# Patient Record
Sex: Male | Born: 1985 | Race: White | Hispanic: No | Marital: Single
Health system: Southern US, Community
[De-identification: ages and names within clinical notes are randomized; demographics above are authoritative.]

---

## 2020-12-21 ENCOUNTER — Inpatient Hospital Stay (HOSPITAL_COMMUNITY)
Admission: EM | Admit: 2020-12-21 | Discharge: 2020-12-25 | DRG: 964 | Disposition: A | Discharge status: Home / Self Care | Payer: 59 | Attending: Surgery | Admitting: Surgery

## 2020-12-21 ENCOUNTER — Inpatient Hospital Stay (HOSPITAL_COMMUNITY): Payer: 59

## 2020-12-21 ENCOUNTER — Emergency Department (HOSPITAL_COMMUNITY): Payer: 59

## 2020-12-21 ENCOUNTER — Other Ambulatory Visit: Payer: Self-pay

## 2020-12-21 ENCOUNTER — Encounter (HOSPITAL_COMMUNITY): Payer: Self-pay

## 2020-12-21 DIAGNOSIS — Z720 Tobacco use: Secondary | ICD-10-CM

## 2020-12-21 DIAGNOSIS — D62 Acute posthemorrhagic anemia: Secondary | ICD-10-CM | POA: Diagnosis present

## 2020-12-21 DIAGNOSIS — S2241XA Multiple fractures of ribs, right side, initial encounter for closed fracture: Secondary | ICD-10-CM | POA: Diagnosis present

## 2020-12-21 DIAGNOSIS — D72829 Elevated white blood cell count, unspecified: Secondary | ICD-10-CM | POA: Diagnosis present

## 2020-12-21 DIAGNOSIS — T1490XA Injury, unspecified, initial encounter: Secondary | ICD-10-CM

## 2020-12-21 DIAGNOSIS — Z20822 Contact with and (suspected) exposure to covid-19: Secondary | ICD-10-CM | POA: Diagnosis present

## 2020-12-21 DIAGNOSIS — S36116A Major laceration of liver, initial encounter: Secondary | ICD-10-CM | POA: Diagnosis present

## 2020-12-21 DIAGNOSIS — S272XXA Traumatic hemopneumothorax, initial encounter: Secondary | ICD-10-CM | POA: Diagnosis present

## 2020-12-21 DIAGNOSIS — W320XXA Accidental handgun discharge, initial encounter: Secondary | ICD-10-CM

## 2020-12-21 DIAGNOSIS — J942 Hemothorax: Secondary | ICD-10-CM

## 2020-12-21 DIAGNOSIS — Y249XXA Unspecified firearm discharge, undetermined intent, initial encounter: Secondary | ICD-10-CM

## 2020-12-21 DIAGNOSIS — S27331A Laceration of lung, unilateral, initial encounter: Secondary | ICD-10-CM | POA: Diagnosis present

## 2020-12-21 DIAGNOSIS — Z9689 Presence of other specified functional implants: Secondary | ICD-10-CM

## 2020-12-21 DIAGNOSIS — J939 Pneumothorax, unspecified: Secondary | ICD-10-CM

## 2020-12-21 DIAGNOSIS — W3400XA Accidental discharge from unspecified firearms or gun, initial encounter: Secondary | ICD-10-CM

## 2020-12-21 DIAGNOSIS — F1120 Opioid dependence, uncomplicated: Secondary | ICD-10-CM | POA: Diagnosis present

## 2020-12-21 DIAGNOSIS — S36119A Unspecified injury of liver, initial encounter: Secondary | ICD-10-CM

## 2020-12-21 LAB — CBC
HCT: 33.2 % — ABNORMAL LOW (ref 39.0–52.0)
HCT: 33.2 % — ABNORMAL LOW (ref 39.0–52.0)
HCT: 32.5 % — ABNORMAL LOW (ref 39.0–52.0)
HCT: 31.8 % — ABNORMAL LOW (ref 39.0–52.0)
Hemoglobin: 11 g/dL — ABNORMAL LOW (ref 13.0–17.0)
Hemoglobin: 11.2 g/dL — ABNORMAL LOW (ref 13.0–17.0)
Hemoglobin: 11.1 g/dL — ABNORMAL LOW (ref 13.0–17.0)
Hemoglobin: 11 g/dL — ABNORMAL LOW (ref 13.0–17.0)
MCH: 31.7 pg (ref 26.0–34.0)
MCH: 32.3 pg (ref 26.0–34.0)
MCH: 32 pg (ref 26.0–34.0)
MCH: 32.1 pg (ref 26.0–34.0)
MCHC: 33.1 g/dL (ref 30.0–36.0)
MCHC: 33.7 g/dL (ref 30.0–36.0)
MCHC: 34.2 g/dL (ref 30.0–36.0)
MCHC: 34.6 g/dL (ref 30.0–36.0)
MCV: 95.7 fL (ref 80.0–100.0)
MCV: 95.7 fL (ref 80.0–100.0)
MCV: 93.7 fL (ref 80.0–100.0)
MCV: 92.7 fL (ref 80.0–100.0)
Platelets: 161 10*3/uL (ref 150–400)
Platelets: 168 10*3/uL (ref 150–400)
Platelets: 179 10*3/uL (ref 150–400)
Platelets: 158 10*3/uL (ref 150–400)
RBC: 3.47 MIL/uL — ABNORMAL LOW (ref 4.22–5.81)
RBC: 3.47 MIL/uL — ABNORMAL LOW (ref 4.22–5.81)
RBC: 3.47 MIL/uL — ABNORMAL LOW (ref 4.22–5.81)
RBC: 3.43 MIL/uL — ABNORMAL LOW (ref 4.22–5.81)
RDW: 12.3 % (ref 11.5–15.5)
RDW: 12.4 % (ref 11.5–15.5)
RDW: 12.4 % (ref 11.5–15.5)
RDW: 12.4 % (ref 11.5–15.5)
WBC: 8.4 10*3/uL (ref 4.0–10.5)
WBC: 12.6 10*3/uL — ABNORMAL HIGH (ref 4.0–10.5)
WBC: 12.1 10*3/uL — ABNORMAL HIGH (ref 4.0–10.5)
WBC: 9.8 10*3/uL (ref 4.0–10.5)
nRBC: 0 % (ref 0.0–0.2)
nRBC: 0 % (ref 0.0–0.2)
nRBC: 0 % (ref 0.0–0.2)
nRBC: 0 % (ref 0.0–0.2)

## 2020-12-21 LAB — URINALYSIS, ROUTINE W REFLEX MICROSCOPIC
Bilirubin Urine: NEGATIVE
Glucose, UA: NEGATIVE mg/dL
Ketones, ur: NEGATIVE mg/dL
Leukocytes,Ua: NEGATIVE
Nitrite: NEGATIVE
Protein, ur: NEGATIVE mg/dL
Specific Gravity, Urine: 1.01 (ref 1.005–1.030)
pH: 6.5 (ref 5.0–8.0)

## 2020-12-21 LAB — URINALYSIS, MICROSCOPIC (REFLEX)
Bacteria, UA: NONE SEEN
Squamous Epithelial / HPF: NONE SEEN (ref 0–5)

## 2020-12-21 LAB — COMPREHENSIVE METABOLIC PANEL
ALT: 111 U/L — ABNORMAL HIGH (ref 0–44)
AST: 102 U/L — ABNORMAL HIGH (ref 15–41)
Albumin: 2.9 g/dL — ABNORMAL LOW (ref 3.5–5.0)
Alkaline Phosphatase: 46 U/L (ref 38–126)
Anion gap: 6 (ref 5–15)
BUN: 12 mg/dL (ref 6–20)
CO2: 25 mmol/L (ref 22–32)
Calcium: 8 mg/dL — ABNORMAL LOW (ref 8.9–10.3)
Chloride: 105 mmol/L (ref 98–111)
Creatinine, Ser: 1.12 mg/dL (ref 0.61–1.24)
GFR, Estimated: >60 mL/min (ref >60)
Glucose, Bld: 195 mg/dL — ABNORMAL HIGH (ref 70–99)
Potassium: 3.3 mmol/L — ABNORMAL LOW (ref 3.5–5.1)
Sodium: 136 mmol/L (ref 135–145)
Total Bilirubin: 0.6 mg/dL (ref 0.3–1.2)
Total Protein: 5.1 g/dL — ABNORMAL LOW (ref 6.5–8.1)

## 2020-12-21 LAB — I-STAT CHEM 8, ED
BUN: 14 mg/dL (ref 6–20)
Calcium, Ion: 1.08 mmol/L — ABNORMAL LOW (ref 1.15–1.40)
Chloride: 101 mmol/L (ref 98–111)
Creatinine, Ser: 1.2 mg/dL (ref 0.61–1.24)
Glucose, Bld: 186 mg/dL — ABNORMAL HIGH (ref 70–99)
HCT: 31 % — ABNORMAL LOW (ref 39.0–52.0)
Hemoglobin: 10.5 g/dL — ABNORMAL LOW (ref 13.0–17.0)
Potassium: 3.3 mmol/L — ABNORMAL LOW (ref 3.5–5.1)
Sodium: 138 mmol/L (ref 135–145)
TCO2: 27 mmol/L (ref 22–32)

## 2020-12-21 LAB — PROTIME-INR
INR: 1.2 (ref 0.8–1.2)
Prothrombin Time: 14.8 seconds (ref 11.4–15.2)

## 2020-12-21 LAB — RESP PANEL BY RT-PCR (FLU A&B, COVID) ARPGX2
Influenza A by PCR: NEGATIVE
Influenza B by PCR: NEGATIVE
SARS Coronavirus 2 by RT PCR: NEGATIVE

## 2020-12-21 LAB — BASIC METABOLIC PANEL
Anion gap: 5 (ref 5–15)
BUN: 12 mg/dL (ref 6–20)
CO2: 25 mmol/L (ref 22–32)
Calcium: 7.9 mg/dL — ABNORMAL LOW (ref 8.9–10.3)
Chloride: 103 mmol/L (ref 98–111)
Creatinine, Ser: 0.93 mg/dL (ref 0.61–1.24)
GFR, Estimated: >60 mL/min (ref >60)
Glucose, Bld: 114 mg/dL — ABNORMAL HIGH (ref 70–99)
Potassium: 3.4 mmol/L — ABNORMAL LOW (ref 3.5–5.1)
Sodium: 133 mmol/L — ABNORMAL LOW (ref 135–145)

## 2020-12-21 LAB — SAMPLE TO BLOOD BANK

## 2020-12-21 LAB — ETHANOL: Alcohol, Ethyl (B): <10 mg/dL (ref <10)

## 2020-12-21 LAB — HIV ANTIBODY (ROUTINE TESTING W REFLEX): HIV Screen 4th Generation wRfx: NONREACTIVE

## 2020-12-21 LAB — LACTIC ACID, PLASMA: Lactic Acid, Venous: 1.6 mmol/L (ref 0.5–1.9)

## 2020-12-21 MED ORDER — OXYCODONE HCL 5 MG PO TABS: 5.0000 mg | ORAL_TABLET | ORAL | Status: DC | PRN

## 2020-12-21 MED ORDER — LACTATED RINGERS IV SOLN: INTRAVENOUS | Status: DC

## 2020-12-21 MED ORDER — METHADONE HCL 10 MG/ML PO CONC
160.0000 mg | Freq: Every day | ORAL | Status: DC
  Administered 2020-12-21: 160 mg via ORAL
  Filled 2020-12-21 (×3): qty 20

## 2020-12-21 MED ORDER — OXYCODONE HCL 5 MG PO TABS
10.0000 mg | ORAL_TABLET | ORAL | Status: DC | PRN
  Administered 2020-12-21 – 2020-12-22 (×4): 10 mg via ORAL
  Filled 2020-12-21 (×4): qty 2

## 2020-12-21 MED ORDER — ACETAMINOPHEN 500 MG PO TABS
1000.0000 mg | ORAL_TABLET | Freq: Four times a day (QID) | ORAL | Status: DC
  Administered 2020-12-21 – 2020-12-25 (×15): 1000 mg via ORAL
  Filled 2020-12-21 (×16): qty 2

## 2020-12-21 MED ORDER — SODIUM CHLORIDE 0.9 % IV SOLN
INTRAVENOUS | Status: AC | PRN
  Administered 2020-12-21: 1000 mL via INTRAVENOUS

## 2020-12-21 MED ORDER — HYDROMORPHONE HCL 1 MG/ML IJ SOLN
0.5000 mg | INTRAMUSCULAR | Status: DC | PRN
  Administered 2020-12-21 – 2020-12-22 (×4): 0.5 mg via INTRAVENOUS
  Filled 2020-12-21: qty 0.5
  Filled 2020-12-21: qty 1
  Filled 2020-12-21: qty 0.5
  Filled 2020-12-21: qty 1

## 2020-12-21 MED ORDER — METHOCARBAMOL 1000 MG/10ML IJ SOLN
1000.0000 mg | Freq: Three times a day (TID) | INTRAVENOUS | Status: DC
  Administered 2020-12-21 – 2020-12-23 (×7): 1000 mg via INTRAVENOUS
  Filled 2020-12-21 (×8): qty 10
  Filled 2020-12-21: qty 1000

## 2020-12-21 MED ORDER — ACETAMINOPHEN 325 MG PO TABS
650.0000 mg | ORAL_TABLET | Freq: Four times a day (QID) | ORAL | Status: DC
  Filled 2020-12-21: qty 2

## 2020-12-21 MED ORDER — FENTANYL CITRATE (PF) 100 MCG/2ML IJ SOLN
INTRAMUSCULAR | Status: AC
  Filled 2020-12-21: qty 2

## 2020-12-21 MED ORDER — DOCUSATE SODIUM 100 MG PO CAPS
100.0000 mg | ORAL_CAPSULE | Freq: Two times a day (BID) | ORAL | Status: DC
  Administered 2020-12-21 – 2020-12-25 (×8): 100 mg via ORAL
  Filled 2020-12-21 (×8): qty 1

## 2020-12-21 MED ORDER — FENTANYL CITRATE PF 50 MCG/ML IJ SOSY
75.0000 ug | PREFILLED_SYRINGE | Freq: Once | INTRAMUSCULAR | Status: AC
  Administered 2020-12-21: 75 ug via INTRAVENOUS

## 2020-12-21 MED ORDER — ONDANSETRON 4 MG PO TBDP: 4.0000 mg | ORAL_TABLET | Freq: Four times a day (QID) | ORAL | Status: DC | PRN

## 2020-12-21 MED ORDER — ONDANSETRON HCL 4 MG/2ML IJ SOLN: 4.0000 mg | Freq: Four times a day (QID) | INTRAMUSCULAR | Status: DC | PRN

## 2020-12-21 MED ORDER — METOPROLOL TARTRATE 5 MG/5ML IV SOLN: 5.0000 mg | Freq: Four times a day (QID) | INTRAVENOUS | Status: DC | PRN

## 2020-12-21 MED ORDER — POLYETHYLENE GLYCOL 3350 17 G PO PACK: 17.0000 g | PACK | Freq: Every day | ORAL | Status: DC | PRN

## 2020-12-21 MED ORDER — IOHEXOL 350 MG/ML SOLN
100.0000 mL | Freq: Once | INTRAVENOUS | Status: AC | PRN
  Administered 2020-12-21: 100 mL via INTRAVENOUS
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
## 2020-12-21 NOTE — ED Notes (Signed)
Emergency consent and time out for R chest tube.

## 2020-12-21 NOTE — ED Notes (Signed)
Pt returned from CT. Janee Morn trauma MD at bedside

## 2020-12-21 NOTE — ED Notes (Signed)
40 mg/4 mL methadone wasted with pharmacist.

## 2020-12-21 NOTE — ED Notes (Signed)
Patient transported to CT with [facility] RN
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
## 2020-12-21 NOTE — ED Notes (Signed)
Trauma provider at bedside

## 2020-12-21 NOTE — Procedures (Signed)
Chest tube insertion  Date/Time: 12/21/2020 1:42 AM Performed by: Violeta Gelinas, MD Authorized by: Violeta Gelinas, MD   Consent:    Consent obtained:  Emergent situation Pre-procedure details:    Skin preparation:  ChloraPrep   Preparation: Patient was prepped and draped in the usual sterile fashion   Anesthesia (see MAR for exact dosages):    Anesthesia method:  Local infiltration Procedure details:    Placement location:  R lateral   Scalpel size:  11   Tube size (Fr):  Minicatheter   Technique: Seldinger     Ultrasound guidance: no     Tension pneumothorax: no     Tube connected to:  Suction   Drainage characteristics:  Bloody   Dressing:  4x4 sterile gauze Post-procedure details:    Patient tolerance of procedure:  Tolerated well, no immediate complications Violeta Gelinas, MD, MPH, FACS Please use [email or web address] to contact on call provider
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
## 2020-12-21 NOTE — Progress Notes (Signed)
Pt arrived to [facility]. A&Ox4, pain around chest tube site. Pt's clothes and shoes placed in cabinet, he also has his phone and wallet on his person. Chest tube placed on wall suction at 20, per order. Chest tube output marked at 1240 mL at 1400 on pt arrival.   Robina Ade, RN
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
## 2020-12-21 NOTE — ED Notes (Signed)
Pt placed on 2L via [place] for comfort, SpO2 remains 100%
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
## 2020-12-22 ENCOUNTER — Inpatient Hospital Stay (HOSPITAL_COMMUNITY): Payer: 59

## 2020-12-22 LAB — CBC
HCT: 32.1 % — ABNORMAL LOW (ref 39.0–52.0)
Hemoglobin: 11 g/dL — ABNORMAL LOW (ref 13.0–17.0)
MCH: 32 pg (ref 26.0–34.0)
MCHC: 34.3 g/dL (ref 30.0–36.0)
MCV: 93.3 fL (ref 80.0–100.0)
Platelets: 155 10*3/uL (ref 150–400)
RBC: 3.44 MIL/uL — ABNORMAL LOW (ref 4.22–5.81)
RDW: 12.4 % (ref 11.5–15.5)
WBC: 9.5 10*3/uL (ref 4.0–10.5)
nRBC: 0 % (ref 0.0–0.2)

## 2020-12-22 LAB — MRSA NEXT GEN BY PCR, NASAL: MRSA by PCR Next Gen: NOT DETECTED

## 2020-12-22 MED ORDER — METHADONE HCL 10 MG/ML PO CONC
160.0000 mg | Freq: Every day | ORAL | Status: DC
  Administered 2020-12-22 – 2020-12-25 (×4): 160 mg via ORAL
  Filled 2020-12-22 (×3): qty 20

## 2020-12-22 MED ORDER — NICOTINE 14 MG/24HR TD PT24
14.0000 mg | MEDICATED_PATCH | Freq: Every day | TRANSDERMAL | Status: DC
  Administered 2020-12-22 – 2020-12-25 (×4): 14 mg via TRANSDERMAL
  Filled 2020-12-22 (×4): qty 1

## 2020-12-22 MED ORDER — POLYETHYLENE GLYCOL 3350 17 G PO PACK
17.0000 g | PACK | Freq: Every day | ORAL | Status: DC
  Administered 2020-12-22 – 2020-12-25 (×4): 17 g via ORAL
  Filled 2020-12-22 (×4): qty 1

## 2020-12-22 MED ORDER — OXYCODONE HCL 5 MG PO TABS
10.0000 mg | ORAL_TABLET | ORAL | Status: DC | PRN
  Administered 2020-12-22: 15 mg via ORAL
  Administered 2020-12-22: 10 mg via ORAL
  Administered 2020-12-23 (×2): 15 mg via ORAL
  Administered 2020-12-23: 10 mg via ORAL
  Administered 2020-12-24 – 2020-12-25 (×3): 15 mg via ORAL
  Administered 2020-12-25: 10 mg via ORAL
  Filled 2020-12-22 (×6): qty 3
  Filled 2020-12-22 (×2): qty 2
  Filled 2020-12-22 (×2): qty 3
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
## 2020-12-23 ENCOUNTER — Inpatient Hospital Stay (HOSPITAL_COMMUNITY): Payer: 59

## 2020-12-23 LAB — URINALYSIS, ROUTINE W REFLEX MICROSCOPIC
Bilirubin Urine: NEGATIVE
Glucose, UA: NEGATIVE mg/dL
Hgb urine dipstick: NEGATIVE
Ketones, ur: NEGATIVE mg/dL
Leukocytes,Ua: NEGATIVE
Nitrite: NEGATIVE
Protein, ur: NEGATIVE mg/dL
Specific Gravity, Urine: 1.009 (ref 1.005–1.030)
pH: 6 (ref 5.0–8.0)

## 2020-12-23 LAB — BASIC METABOLIC PANEL
Anion gap: 5 (ref 5–15)
BUN: 8 mg/dL (ref 6–20)
CO2: 27 mmol/L (ref 22–32)
Calcium: 8.7 mg/dL — ABNORMAL LOW (ref 8.9–10.3)
Chloride: 104 mmol/L (ref 98–111)
Creatinine, Ser: 0.97 mg/dL (ref 0.61–1.24)
GFR, Estimated: >60 mL/min (ref >60)
Glucose, Bld: 93 mg/dL (ref 70–99)
Potassium: 4 mmol/L (ref 3.5–5.1)
Sodium: 136 mmol/L (ref 135–145)

## 2020-12-23 LAB — CBC
HCT: 35.5 % — ABNORMAL LOW (ref 39.0–52.0)
Hemoglobin: 11.8 g/dL — ABNORMAL LOW (ref 13.0–17.0)
MCH: 31.6 pg (ref 26.0–34.0)
MCHC: 33.2 g/dL (ref 30.0–36.0)
MCV: 95.2 fL (ref 80.0–100.0)
Platelets: 158 10*3/uL (ref 150–400)
RBC: 3.73 MIL/uL — ABNORMAL LOW (ref 4.22–5.81)
RDW: 12.5 % (ref 11.5–15.5)
WBC: 13.1 10*3/uL — ABNORMAL HIGH (ref 4.0–10.5)
nRBC: 0 % (ref 0.0–0.2)

## 2020-12-23 MED ORDER — METHOCARBAMOL 500 MG PO TABS
1000.0000 mg | ORAL_TABLET | Freq: Three times a day (TID) | ORAL | Status: DC
  Administered 2020-12-23 – 2020-12-25 (×7): 1000 mg via ORAL
  Filled 2020-12-23 (×7): qty 2

## 2020-12-23 MED ORDER — GUAIFENESIN ER 600 MG PO TB12
600.0000 mg | ORAL_TABLET | Freq: Two times a day (BID) | ORAL | Status: DC
  Administered 2020-12-23 – 2020-12-25 (×5): 600 mg via ORAL
  Filled 2020-12-23 (×5): qty 1

## 2020-12-23 MED ORDER — ENOXAPARIN SODIUM 30 MG/0.3ML IJ SOSY
30.0000 mg | PREFILLED_SYRINGE | Freq: Two times a day (BID) | INTRAMUSCULAR | Status: DC
  Administered 2020-12-23 – 2020-12-25 (×5): 30 mg via SUBCUTANEOUS
  Filled 2020-12-23 (×5): qty 0.3
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
## 2020-12-24 ENCOUNTER — Inpatient Hospital Stay (HOSPITAL_COMMUNITY): Payer: 59

## 2020-12-24 LAB — URINE CULTURE: Culture: NO GROWTH

## 2020-12-24 LAB — BASIC METABOLIC PANEL
Anion gap: 7 (ref 5–15)
BUN: 12 mg/dL (ref 6–20)
CO2: 25 mmol/L (ref 22–32)
Calcium: 8.5 mg/dL — ABNORMAL LOW (ref 8.9–10.3)
Chloride: 105 mmol/L (ref 98–111)
Creatinine, Ser: 0.94 mg/dL (ref 0.61–1.24)
GFR, Estimated: >60 mL/min (ref >60)
Glucose, Bld: 104 mg/dL — ABNORMAL HIGH (ref 70–99)
Potassium: 3.8 mmol/L (ref 3.5–5.1)
Sodium: 137 mmol/L (ref 135–145)

## 2020-12-24 LAB — CBC
HCT: 32.4 % — ABNORMAL LOW (ref 39.0–52.0)
Hemoglobin: 11 g/dL — ABNORMAL LOW (ref 13.0–17.0)
MCH: 31.4 pg (ref 26.0–34.0)
MCHC: 34 g/dL (ref 30.0–36.0)
MCV: 92.6 fL (ref 80.0–100.0)
Platelets: 183 10*3/uL (ref 150–400)
RBC: 3.5 MIL/uL — ABNORMAL LOW (ref 4.22–5.81)
RDW: 12.7 % (ref 11.5–15.5)
WBC: 10.5 10*3/uL (ref 4.0–10.5)
nRBC: 0 % (ref 0.0–0.2)
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
## 2020-12-25 ENCOUNTER — Inpatient Hospital Stay (HOSPITAL_COMMUNITY): Payer: 59

## 2020-12-25 MED ORDER — ACETAMINOPHEN 500 MG PO TABS: 1000.0000 mg | ORAL_TABLET | Freq: Four times a day (QID) | ORAL | 0 refills | Status: AC | PRN

## 2020-12-25 MED ORDER — METHOCARBAMOL 1000 MG PO TABS: 1000.0000 mg | ORAL_TABLET | Freq: Three times a day (TID) | ORAL | 0 refills | Status: AC | PRN

## 2020-12-25 MED ORDER — METHOCARBAMOL 1000 MG PO TABS
1000.0000 mg | ORAL_TABLET | Freq: Three times a day (TID) | ORAL | 0 refills | Status: DC | PRN
Start: 2020-12-25 — End: 2020-12-25

## 2020-12-25 MED ORDER — OXYCODONE HCL 10 MG PO TABS: 10.0000 mg | ORAL_TABLET | ORAL | 0 refills | Status: AC | PRN

## 2020-12-25 MED ORDER — OXYCODONE HCL 10 MG PO TABS: 10.0000 mg | ORAL_TABLET | ORAL | 0 refills | Status: DC | PRN

## 2020-12-25 MED ORDER — DOCUSATE SODIUM 100 MG PO CAPS: 100.0000 mg | ORAL_CAPSULE | Freq: Two times a day (BID) | ORAL | 0 refills | Status: AC

## 2020-12-25 MED ORDER — POLYETHYLENE GLYCOL 3350 17 G PO PACK: 17.0000 g | PACK | Freq: Every day | ORAL | 0 refills | Status: AC | PRN

## 2020-12-25 NOTE — Progress Notes (Signed)
Pt discharged home. All room belongings packed, pt educated on discharge material, 2 PIVs removed. Pt had no questions and was escorted to private vehicle in wheelchair by NT.   Robina Ade, RN

## 2020-12-25 NOTE — Discharge Instructions (Signed)
[facility] DISCHARGE INSTRUCTIONS  Activity You may resume activities as tolerated. Do not drive while taking narcotic pain medication.  Wound Care You may remove the dressing from your chest tube site in 48 hours. You may change the dressing as needed before then to shower or if the dressing becomes saturated.  When to Call us: Fever greater than 100.5 New redness, drainage, or swelling at incision site Severe pain, nausea, or vomiting Difficulty breathing  Follow-up You have an appointment scheduled in 2 weeks at the [facility] Surgery office at [address], [address], [place], [place]. You will need to get an X-ray of your chest done just prior to this visit. You will be contacted by our office this week to schedule this.   For questions or concerns, please call the [facility] Surgery office at [phone].
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
## 2021-01-09 ENCOUNTER — Other Ambulatory Visit: Payer: Self-pay | Admitting: General Surgery

## 2021-01-09 ENCOUNTER — Ambulatory Visit
Admission: RE | Admit: 2021-01-09 | Discharge: 2021-01-09 | Disposition: A | Discharge status: Home / Self Care | Payer: 59 | Source: Ambulatory Visit | Attending: General Surgery | Admitting: General Surgery

## 2021-01-09 ENCOUNTER — Other Ambulatory Visit: Payer: Self-pay

## 2021-01-09 DIAGNOSIS — J942 Hemothorax: Secondary | ICD-10-CM

## 2021-01-24 ENCOUNTER — Ambulatory Visit
Admission: RE | Admit: 2021-01-24 | Discharge: 2021-01-24 | Disposition: A | Discharge status: Home / Self Care | Payer: Self-pay | Source: Ambulatory Visit | Attending: Surgery | Admitting: Surgery

## 2021-01-24 ENCOUNTER — Other Ambulatory Visit: Payer: Self-pay

## 2021-01-24 ENCOUNTER — Other Ambulatory Visit: Payer: Self-pay | Admitting: Surgery

## 2021-01-24 DIAGNOSIS — S2231XK Fracture of one rib, right side, subsequent encounter for fracture with nonunion: Secondary | ICD-10-CM

## 2021-01-24 DIAGNOSIS — W3400XA Accidental discharge from unspecified firearms or gun, initial encounter: Secondary | ICD-10-CM

## 2021-01-24 DIAGNOSIS — J90 Pleural effusion, not elsewhere classified: Secondary | ICD-10-CM

## 2021-02-08 ENCOUNTER — Other Ambulatory Visit: Payer: Self-pay

## 2021-02-08 ENCOUNTER — Other Ambulatory Visit: Payer: Self-pay | Admitting: Physician Assistant

## 2021-02-08 ENCOUNTER — Ambulatory Visit
Admission: RE | Admit: 2021-02-08 | Discharge: 2021-02-08 | Disposition: A | Discharge status: Home / Self Care | Payer: PRIVATE HEALTH INSURANCE | Source: Ambulatory Visit | Attending: Physician Assistant | Admitting: Physician Assistant

## 2021-02-08 DIAGNOSIS — J90 Pleural effusion, not elsewhere classified: Secondary | ICD-10-CM
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
